# Patient Record
Sex: Male | Born: 1935 | Race: Black or African American | Hispanic: No
Health system: Southern US, Community
[De-identification: ages and names within clinical notes are randomized; demographics above are authoritative.]

---

## 2012-01-21 ENCOUNTER — Ambulatory Visit: Payer: Self-pay | Admitting: Ophthalmology

## 2012-08-25 ENCOUNTER — Ambulatory Visit: Payer: Self-pay | Admitting: Ophthalmology

## 2018-08-19 ENCOUNTER — Emergency Department: Payer: Medicare Other

## 2018-08-19 ENCOUNTER — Other Ambulatory Visit: Payer: Self-pay

## 2018-08-19 ENCOUNTER — Emergency Department
Admission: EM | Admit: 2018-08-19 | Discharge: 2018-08-19 | Disposition: A | Discharge status: Home / Self Care | Payer: Medicare Other | Attending: Emergency Medicine | Admitting: Emergency Medicine

## 2018-08-19 DIAGNOSIS — R142 Eructation: Secondary | ICD-10-CM | POA: Diagnosis not present

## 2018-08-19 DIAGNOSIS — R079 Chest pain, unspecified: Secondary | ICD-10-CM

## 2018-08-19 DIAGNOSIS — R001 Bradycardia, unspecified: Secondary | ICD-10-CM | POA: Diagnosis not present

## 2018-08-19 DIAGNOSIS — N289 Disorder of kidney and ureter, unspecified: Secondary | ICD-10-CM | POA: Diagnosis not present

## 2018-08-19 DIAGNOSIS — I2510 Atherosclerotic heart disease of native coronary artery without angina pectoris: Secondary | ICD-10-CM | POA: Diagnosis not present

## 2018-08-19 DIAGNOSIS — I10 Essential (primary) hypertension: Secondary | ICD-10-CM | POA: Diagnosis not present

## 2018-08-19 LAB — BASIC METABOLIC PANEL
ANION GAP: 10 (ref 5–15)
BUN: 23 mg/dL (ref 8–23)
CHLORIDE: 104 mmol/L (ref 98–111)
CO2: 27 mmol/L (ref 22–32)
Calcium: 9.2 mg/dL (ref 8.9–10.3)
Creatinine, Ser: 1.37 mg/dL — ABNORMAL HIGH (ref 0.61–1.24)
GFR calc non Af Amer: 42 mL/min — ABNORMAL LOW (ref >60)
GFR, EST AFRICAN AMERICAN: 48 mL/min — AB (ref >60)
Glucose, Bld: 100 mg/dL — ABNORMAL HIGH (ref 70–99)
POTASSIUM: 3.9 mmol/L (ref 3.5–5.1)
Sodium: 141 mmol/L (ref 135–145)

## 2018-08-19 LAB — CBC
HEMATOCRIT: 33 % — AB (ref 39.0–52.0)
HEMOGLOBIN: 10.4 g/dL — AB (ref 13.0–17.0)
MCH: 26.6 pg (ref 26.0–34.0)
MCHC: 31.5 g/dL (ref 30.0–36.0)
MCV: 84.4 fL (ref 80.0–100.0)
NRBC: 0 % (ref 0.0–0.2)
Platelets: 177 10*3/uL (ref 150–400)
RBC: 3.91 MIL/uL — ABNORMAL LOW (ref 4.22–5.81)
RDW: 16 % — ABNORMAL HIGH (ref 11.5–15.5)
WBC: 4.2 10*3/uL (ref 4.0–10.5)

## 2018-08-19 LAB — TROPONIN I
Troponin I: <0.03 ng/mL (ref <0.03)
Troponin I: <0.03 ng/mL (ref <0.03)

## 2018-08-19 MED ORDER — ASPIRIN 81 MG PO CHEW
324.0000 mg | CHEWABLE_TABLET | Freq: Once | ORAL | Status: AC
  Administered 2018-08-19: 324 mg via ORAL
  Filled 2018-08-19: qty 4

## 2018-08-19 NOTE — ED Provider Notes (Addendum)
[facility] Emergency Department Provider Note  ____________________________________________  Time seen: Approximately 5:25 PM  I have reviewed the triage vital signs and the nursing notes.   HISTORY  Chief Complaint Chest Pain    HPI Alexander Houston is a 84 y.o. male history of HTN, CAD status post MI 15 to 20 years ago, presenting for chest pain.  The patient reports that he mowed his lawn for 2 to 3 hours this morning.  He then came inside and had lunch.  Afterwards, he developed a "deep ache" in the left side of his chest, felt like he needed to burp, hit himself on the chest, burped, and the pain completely resolved.  He never had any associated shortness of breath, lightheadedness or fainting, diaphoresis, nausea or vomiting.  He has not had chest pain prior to this episode and has not had any chest pain since then.  He went to his primary care physician's office for evaluation, and they did not have an EKG machine there, so he was sent here for evaluation of chest pain.  At this time, the patient continues to be asymptomatic.  He does not take a daily aspirin.  No past medical history on file.  There are no active problems to display for this patient.       Allergies Patient has no known allergies.  No family history on file.  Social History Social History   Tobacco Use  . Smoking status: Not on file  Substance Use Topics  . Alcohol use: Not on file  . Drug use: Not on file    Review of Systems Constitutional: No fever/chills.  Headedness or syncope.  No diaphoresis. Eyes: No visual changes. ENT: No sore throat. No congestion or rhinorrhea. Cardiovascular: +chest pain. Denies palpitations. Respiratory: Denies shortness of breath.  No cough. Gastrointestinal: No abdominal pain.  No nausea, no vomiting.  No diarrhea.  No constipation.  Positive burping. Genitourinary: Negative for dysuria. Musculoskeletal: Negative for back pain.   Positive chronic unchanged bilateral lower extremity swelling, better today than usual. Skin: Negative for rash. Neurological: Negative for headaches. No focal numbness, tingling or weakness.     ____________________________________________   PHYSICAL EXAM:  VITAL SIGNS: ED Triage Vitals  Enc Vitals Group     BP 08/19/18 1641 (!) 161/80     Pulse Rate 08/19/18 1626 62     Resp 08/19/18 1626 18     Temp 08/19/18 1626 97.6 F (36.4 C)     Temp Source 08/19/18 1626 Oral     SpO2 08/19/18 1626 98 %     Weight 08/19/18 1627 155 lb (70.3 kg)     Height 08/19/18 1627 6\' 3"  (1.905 m)     Head Circumference --      Peak Flow --      Pain Score 08/19/18 1626 0     Pain Loc --      Pain Edu? --      Excl. in GC? --     Constitutional: Alert and oriented. Answers questions appropriately. Eyes: Conjunctivae are normal.  EOMI. No scleral icterus. Head: Atraumatic. Nose: No congestion/rhinnorhea. EARS: Hearing. Mouth/Throat: Mucous membranes are moist.  Neck: No stridor.  Supple.  No JVD.  No meningismus. Cardiovascular: Normal rate, regular rhythm. No murmurs, rubs or gallops.  Respiratory: Normal respiratory effort.  No accessory muscle use or retractions. Lungs CTAB.  No wheezes, rales or ronchi. Gastrointestinal: Soft, nontender and nondistended.  No guarding or rebound.  No peritoneal  signs. Musculoskeletal: Mild pitting LE edema to the distal tibial shaft. No ttp in the calves or palpable cords.  Negative Homan's sign. Neurologic: alert peer.  Speech is clear.  Face and smile are symmetric.  EOMI.  Moves all extremities well. Skin:  Skin is warm, dry and intact. No rash noted. Psychiatric: Mood and affect are normal. Speech and behavior are normal.  Normal judgement  ____________________________________________   LABS (all labs ordered are listed, but only abnormal results are displayed)  Labs Reviewed  BASIC METABOLIC PANEL - Abnormal; Notable for the following components:       Result Value   Glucose, Bld 100 (*)    Creatinine, Ser 1.37 (*)    GFR calc non Af Amer 42 (*)    GFR calc Af Amer 48 (*)    All other components within normal limits  CBC - Abnormal; Notable for the following components:   RBC 3.91 (*)    Hemoglobin 10.4 (*)    HCT 33.0 (*)    RDW 16.0 (*)    All other components within normal limits  TROPONIN I  TROPONIN I   ____________________________________________  EKG  ED ECG REPORT I, Anne-Caroline Sharma Covert, the attending physician, personally viewed and interpreted this ECG.   Date: 08/19/2018  EKG Time: 1628  Rate: 63  Rhythm: normal sinus rhythm; +PVC  Axis: normal  Intervals:nonspecific intraventricular conduction delay.  ST&T Change: No STEMI criteria.  The patient does have PVC and a poor baseline tracing.  He has 3 mm of ST elevation in V3, which is an isolated finding.  The remainder of his EKG does not show any ischemic changes.  A strip has been ordered for further evaluation.  ED ECG REPORT - STRIP ORDERED I, Anne-Caroline Sharma Covert, the attending physician, personally viewed and interpreted this ECG.   Date: 08/19/2018  EKG Time: 1634  Rate: 62  Rhythm: normal sinus rhythm  Axis: normal  Intervals:none  ST&T Change: The patient continues to have isolated 3-4 mm of ST elevation isolated to V3 without any other ischemic changes or reciprocal changes.  We will get an additional EKG for reevaluation.  ED ECG REPORT I, Anne-Caroline Sharma Covert, the attending physician, personally viewed and interpreted this ECG.   Date: 08/19/2018  EKG Time: 1723  Rate: 54  Rhythm: sinus bradycardia  Axis: normal  Intervals:none  ST&T Change: Unchanged ST elevation isolated to V3.  The EKG machine reads this is a junctional rhythm although I do see P waves and favor sinus rhythm.    ____________________________________________  RADIOLOGY  Dg Chest 2 View  Result Date: 08/19/2018 CLINICAL DATA:  Chest pain. EXAM: CHEST - 2 VIEW  COMPARISON:  None. FINDINGS: The heart size and mediastinal contours are within normal limits. No pneumothorax or pleural effusion is noted. Minimal bibasilar subsegmental atelectasis or scarring is noted. The visualized skeletal structures are unremarkable. IMPRESSION: Minimal bibasilar subsegmental atelectasis or scarring. Electronically Signed   By: Lupita Raider, M.D.   On: 08/19/2018 17:16    ____________________________________________   PROCEDURES  Procedure(s) performed: None  Procedures  Critical Care performed: No ____________________________________________   INITIAL IMPRESSION / ASSESSMENT AND PLAN / ED COURSE  Pertinent labs & imaging results that were available during my care of the patient were reviewed by me and considered in my medical decision making (see chart for details).  84 y.o. male with a history of CAD and remote MI, HTN, presenting with an atypical chest pain episode which occurred after lunch, which  was brief and resolved completely with an episode of burping.  Overall, the patient's history is most suggestive of a GI cause for his chest pain.  He did not have any chest pain with exertion of mowing the lawn, but was at rest and had just eaten when he did develop chest pain his chest pain completely resolved after burping.  The patient does have an isolated elevation in V3 which is consistent over 3 different EKGs.  I do not have any prior EKGs, but given his lack of symptoms and isolated finding without reciprocal changes, I favor this not to represent ischemia.  However, the patient does have risk factors, especially at his age and a thorough ED evaluation is warranted.  Today, the patient has a creatinine of 1.37, and I do not have any prior labs on him.  The patient's first troponin is negative and a second troponin is pending.  We will give him aspirin for cardioprotection.  The patient is adamant that he will be discharged home, regardless of the findings  today.  I think this is a reasonable course of his second troponin is negative, and he will follow-up with his PMD.  ----------------------------------------- 8:23 PM on 08/19/2018 -----------------------------------------  The patient's repeat troponin is also negative and he continues to be chest pain-free and hemodynamically stable.  At this time, the patient will be discharged home.  Follow up instructions and return precautions were discussed.  ____________________________________________  FINAL CLINICAL IMPRESSION(S) / ED DIAGNOSES  Final diagnoses:  Chest pain, unspecified type  Burping  Renal insufficiency  Sinus bradycardia         NEW MEDICATIONS STARTED DURING THIS VISIT:  New Prescriptions   No medications on file      Rockne Menghini, MD 08/19/18 1737    Rockne Menghini, MD 08/19/18 2024

## 2018-08-19 NOTE — ED Notes (Signed)
Pt cleared by MD to have po fluids. Pt given ice water and reminded to drink slowly by this RN

## 2018-08-19 NOTE — Discharge Instructions (Addendum)
Please eat small regular meals throughout the day, and do not eat too quickly.  Avoid spicy foods or carbonated beverages.  Please return to the emergency department if you develop severe pain, chest pain, shortness of breath, sweatiness or clamminess, palpitations, nausea or vomiting, lightheadedness or fainting, or any other symptoms concerning to you.

## 2018-08-19 NOTE — ED Notes (Signed)
Dr Sharma Covert ok'd pt to go over to x-ray prior to seeing the pat, pt not in any distress at this time, cont to deny pain

## 2018-08-19 NOTE — ED Triage Notes (Signed)
Pt in bathroom with this RN ready for his triage, pt reports chest pain for the past 4 days, pt reports hx of MI, pt went to Dr Maree Krabbe office who sent him here due to not having an EKG machine in the office per family.

## 2019-05-22 DEATH — deceased

## 2019-07-29 IMAGING — CR DG CHEST 2V
2 series · 2 of 2 positions shown · non-contrast
Comparison: None.

CLINICAL DATA: Chest pain.

EXAM:
CHEST - 2 VIEW

[chest pa]
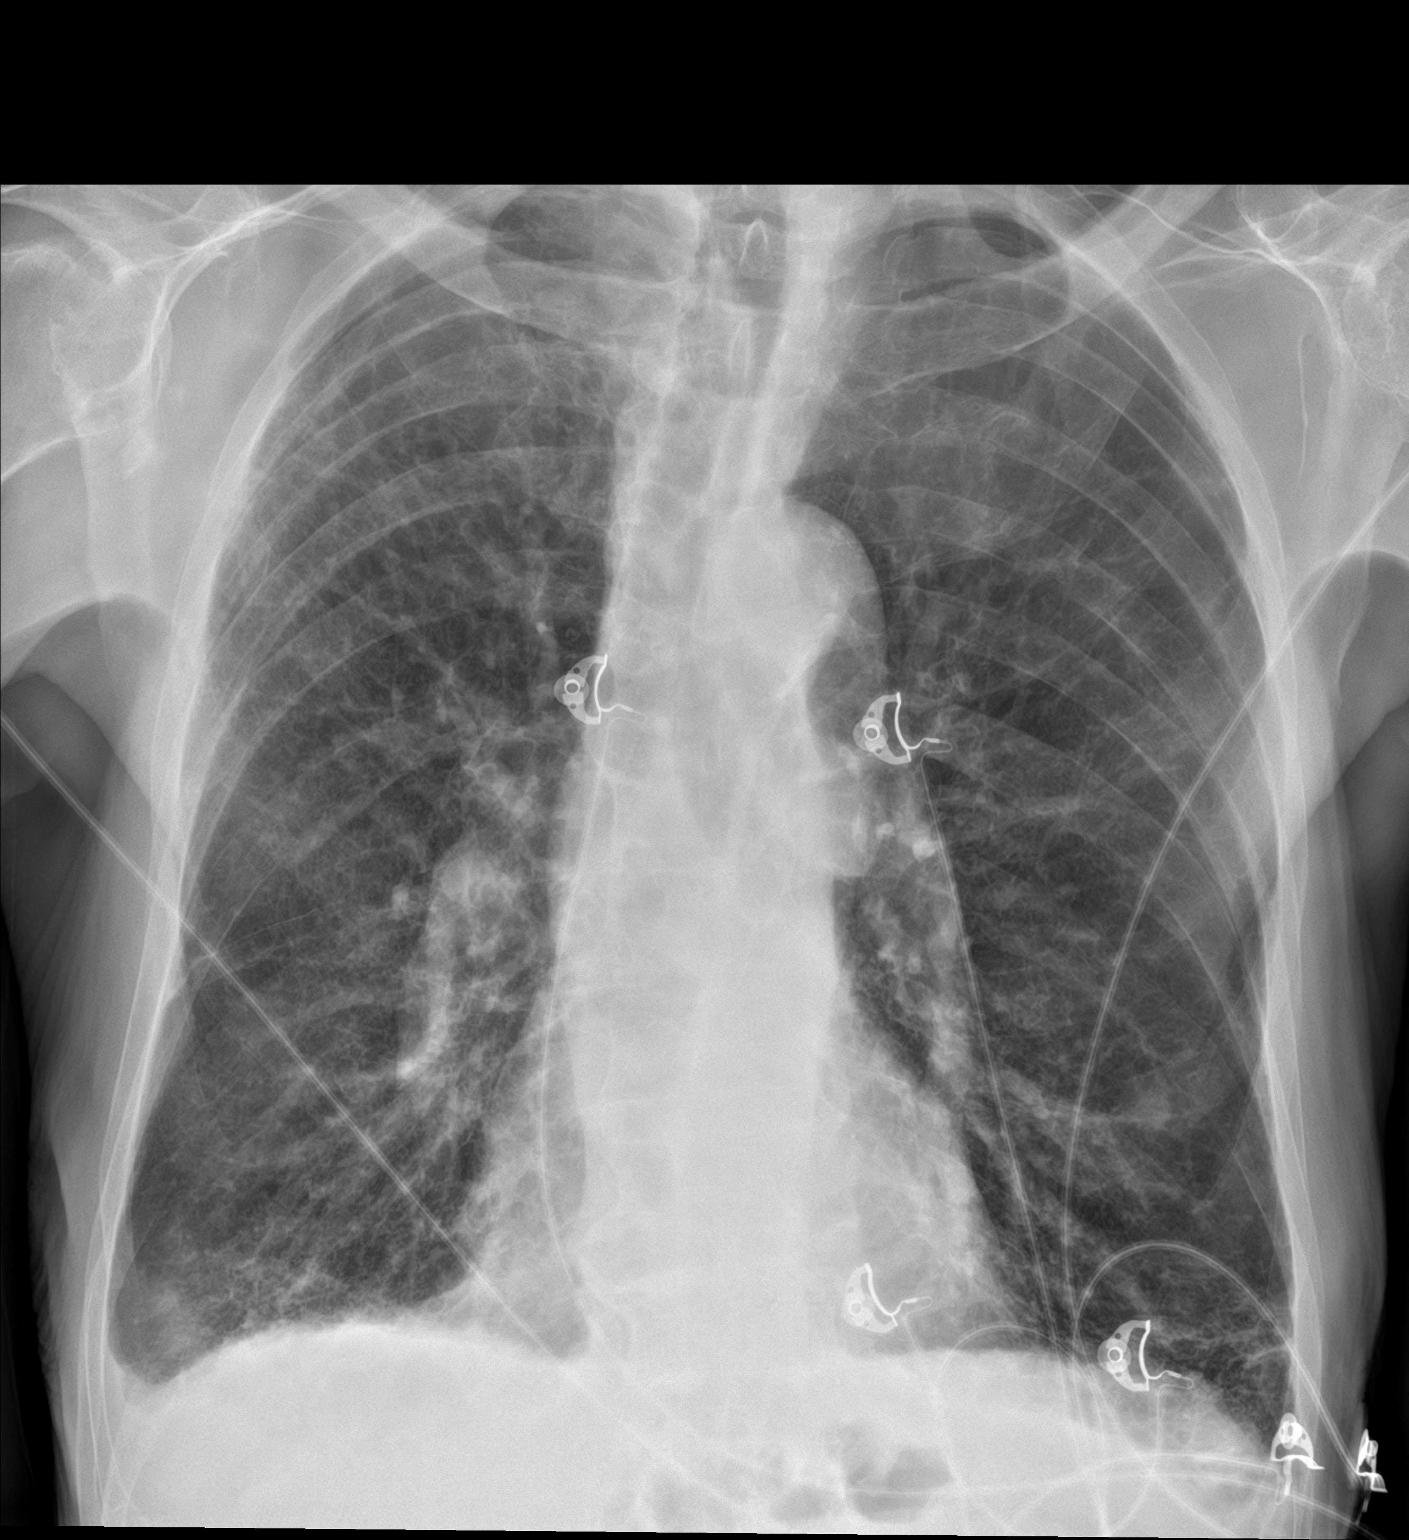

[chest lat]
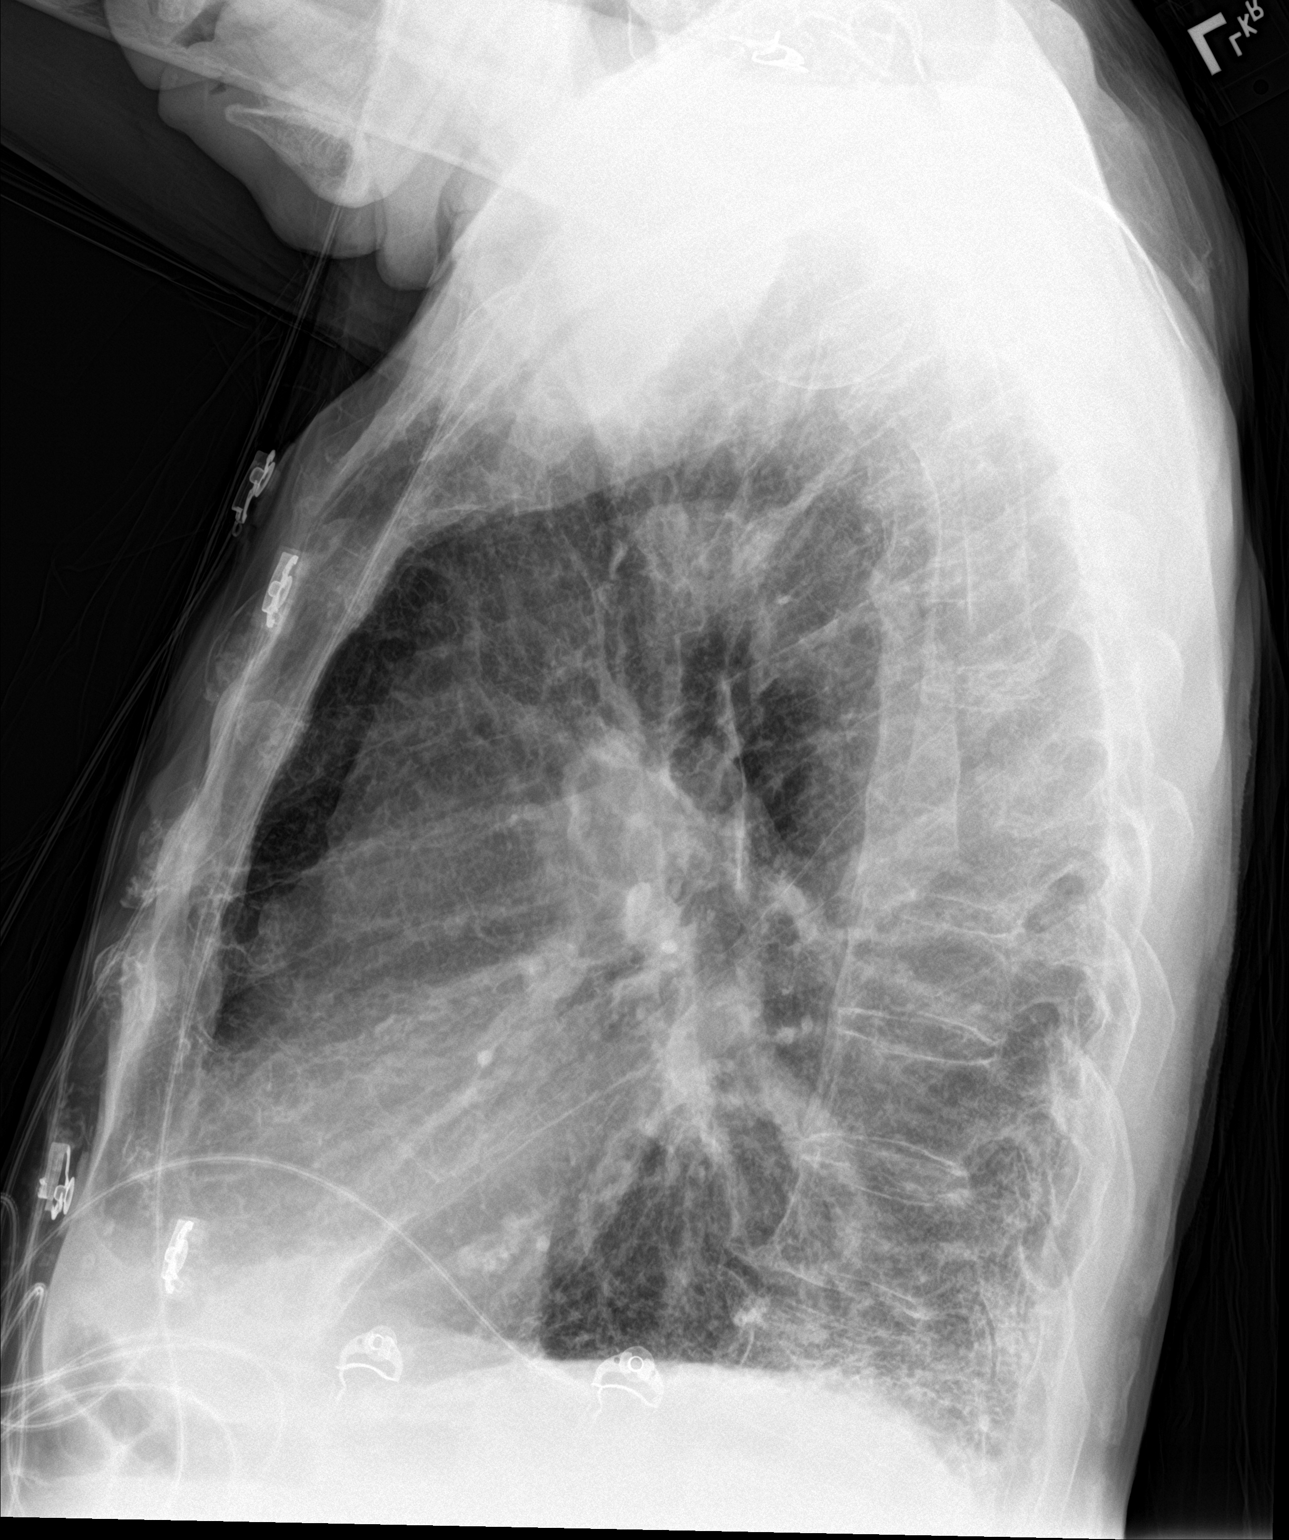

[2 of 2 positions shown; findings below may reference images not displayed]

FINDINGS: The heart size and mediastinal contours are within normal limits. No
pneumothorax or pleural effusion is noted. Minimal bibasilar
subsegmental atelectasis or scarring is noted. The visualized
skeletal structures are unremarkable.
IMPRESSION: Minimal bibasilar subsegmental atelectasis or scarring.
